# Patient Record
Sex: Female | Born: 1965 | Hispanic: No | Marital: Married | Smoking: Former smoker
Health system: Southern US, Community
[De-identification: ages and names within clinical notes are randomized; demographics above are authoritative.]

## PROBLEM LIST (undated history)

## (undated) DIAGNOSIS — N83209 Unspecified ovarian cyst, unspecified side: Secondary | ICD-10-CM

## (undated) DIAGNOSIS — D649 Anemia, unspecified: Secondary | ICD-10-CM

## (undated) DIAGNOSIS — K279 Peptic ulcer, site unspecified, unspecified as acute or chronic, without hemorrhage or perforation: Secondary | ICD-10-CM

## (undated) DIAGNOSIS — K648 Other hemorrhoids: Secondary | ICD-10-CM

## (undated) DIAGNOSIS — K769 Liver disease, unspecified: Secondary | ICD-10-CM

## (undated) DIAGNOSIS — Z227 Latent tuberculosis: Secondary | ICD-10-CM

## (undated) HISTORY — DX: Latent tuberculosis: Z22.7

## (undated) HISTORY — DX: Peptic ulcer, site unspecified, unspecified as acute or chronic, without hemorrhage or perforation: K27.9

## (undated) HISTORY — DX: Unspecified ovarian cyst, unspecified side: N83.209

## (undated) HISTORY — DX: Anemia, unspecified: D64.9

## (undated) HISTORY — DX: Other hemorrhoids: K64.8

## (undated) HISTORY — DX: Liver disease, unspecified: K76.9

---

## 1998-09-19 DIAGNOSIS — Z227 Latent tuberculosis: Secondary | ICD-10-CM

## 1998-09-19 HISTORY — DX: Latent tuberculosis: Z22.7

## 1999-09-20 HISTORY — PX: LAPAROSCOPY: SHX197

## 2006-09-19 DIAGNOSIS — K769 Liver disease, unspecified: Secondary | ICD-10-CM

## 2006-09-19 HISTORY — DX: Liver disease, unspecified: K76.9

## 2010-07-21 ENCOUNTER — Ambulatory Visit: Payer: Self-pay | Admitting: Family Medicine

## 2010-08-13 ENCOUNTER — Ambulatory Visit: Payer: Self-pay | Admitting: Physician Assistant

## 2010-08-13 ENCOUNTER — Encounter: Admission: RE | Admit: 2010-08-13 | Discharge: 2010-08-13 | Payer: Self-pay | Admitting: Family Medicine

## 2010-10-20 DIAGNOSIS — K279 Peptic ulcer, site unspecified, unspecified as acute or chronic, without hemorrhage or perforation: Secondary | ICD-10-CM

## 2010-10-20 HISTORY — PX: ESOPHAGOGASTRODUODENOSCOPY: SHX1529

## 2010-10-20 HISTORY — DX: Peptic ulcer, site unspecified, unspecified as acute or chronic, without hemorrhage or perforation: K27.9

## 2010-10-20 HISTORY — PX: COLONOSCOPY: SHX5424

## 2011-04-17 IMAGING — CR DG THORACIC SPINE 3V
3 series · 3 of 3 positions shown · non-contrast
Comparison: None.

CLINICAL DATA: Chest and upper back pain.

THORACIC SPINE - 2 VIEW + SWIMMERS

[t t-spine a.p.]
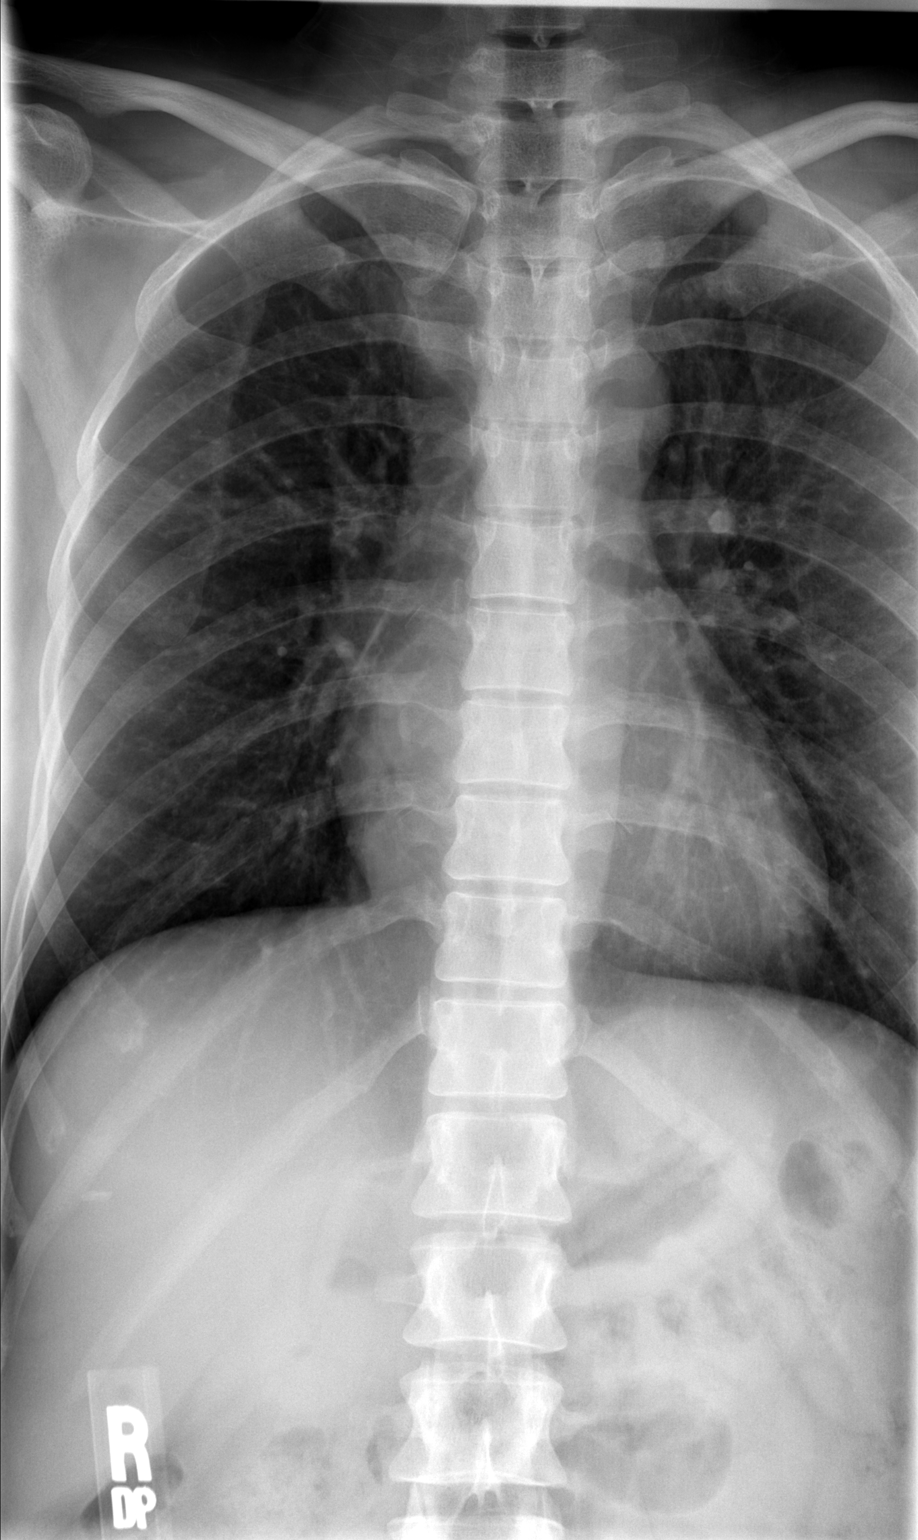

[t t-spine lat *]
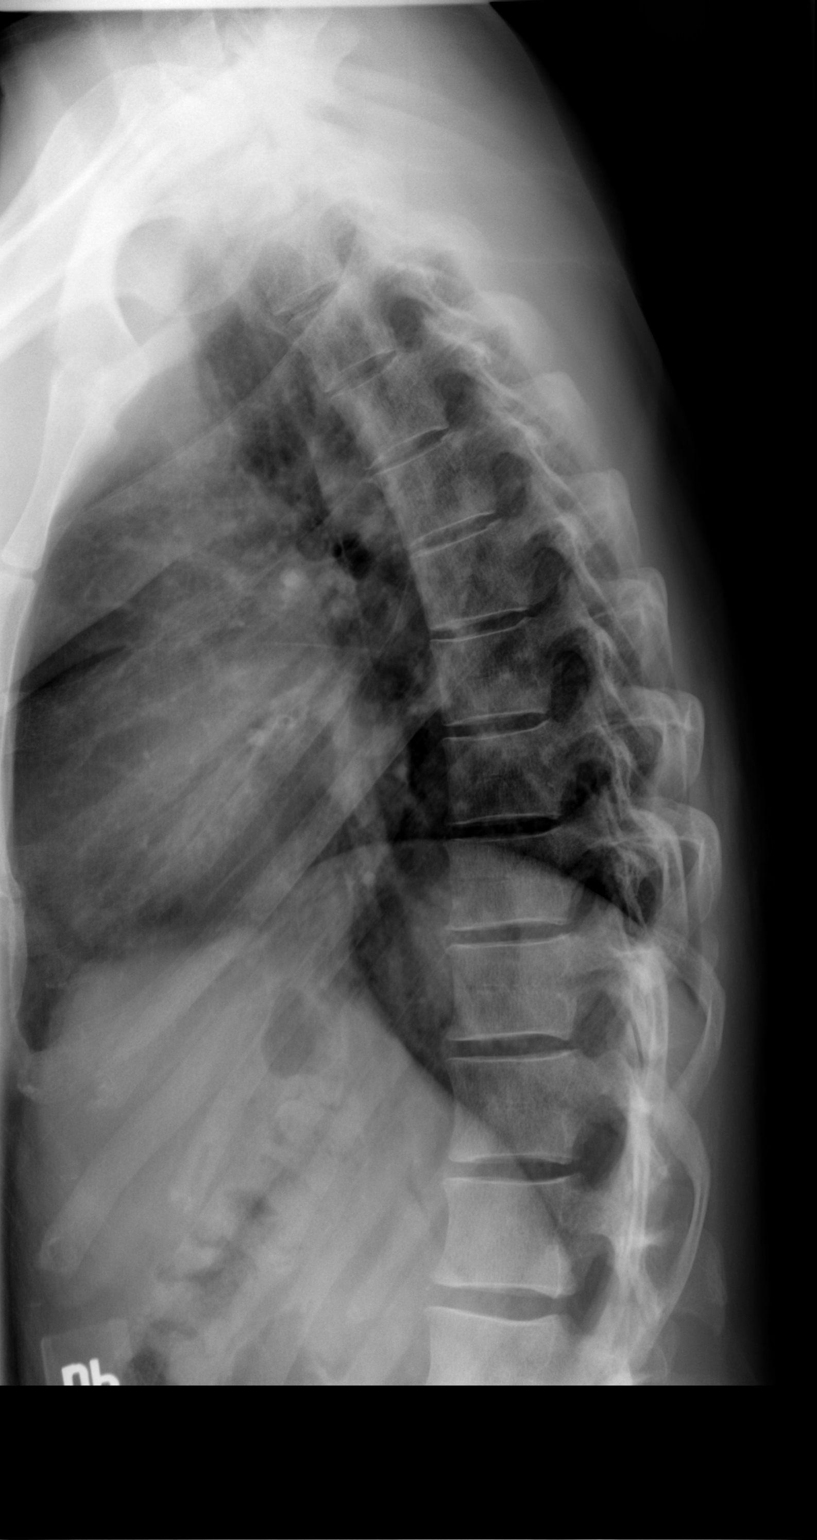

[t swimmers]
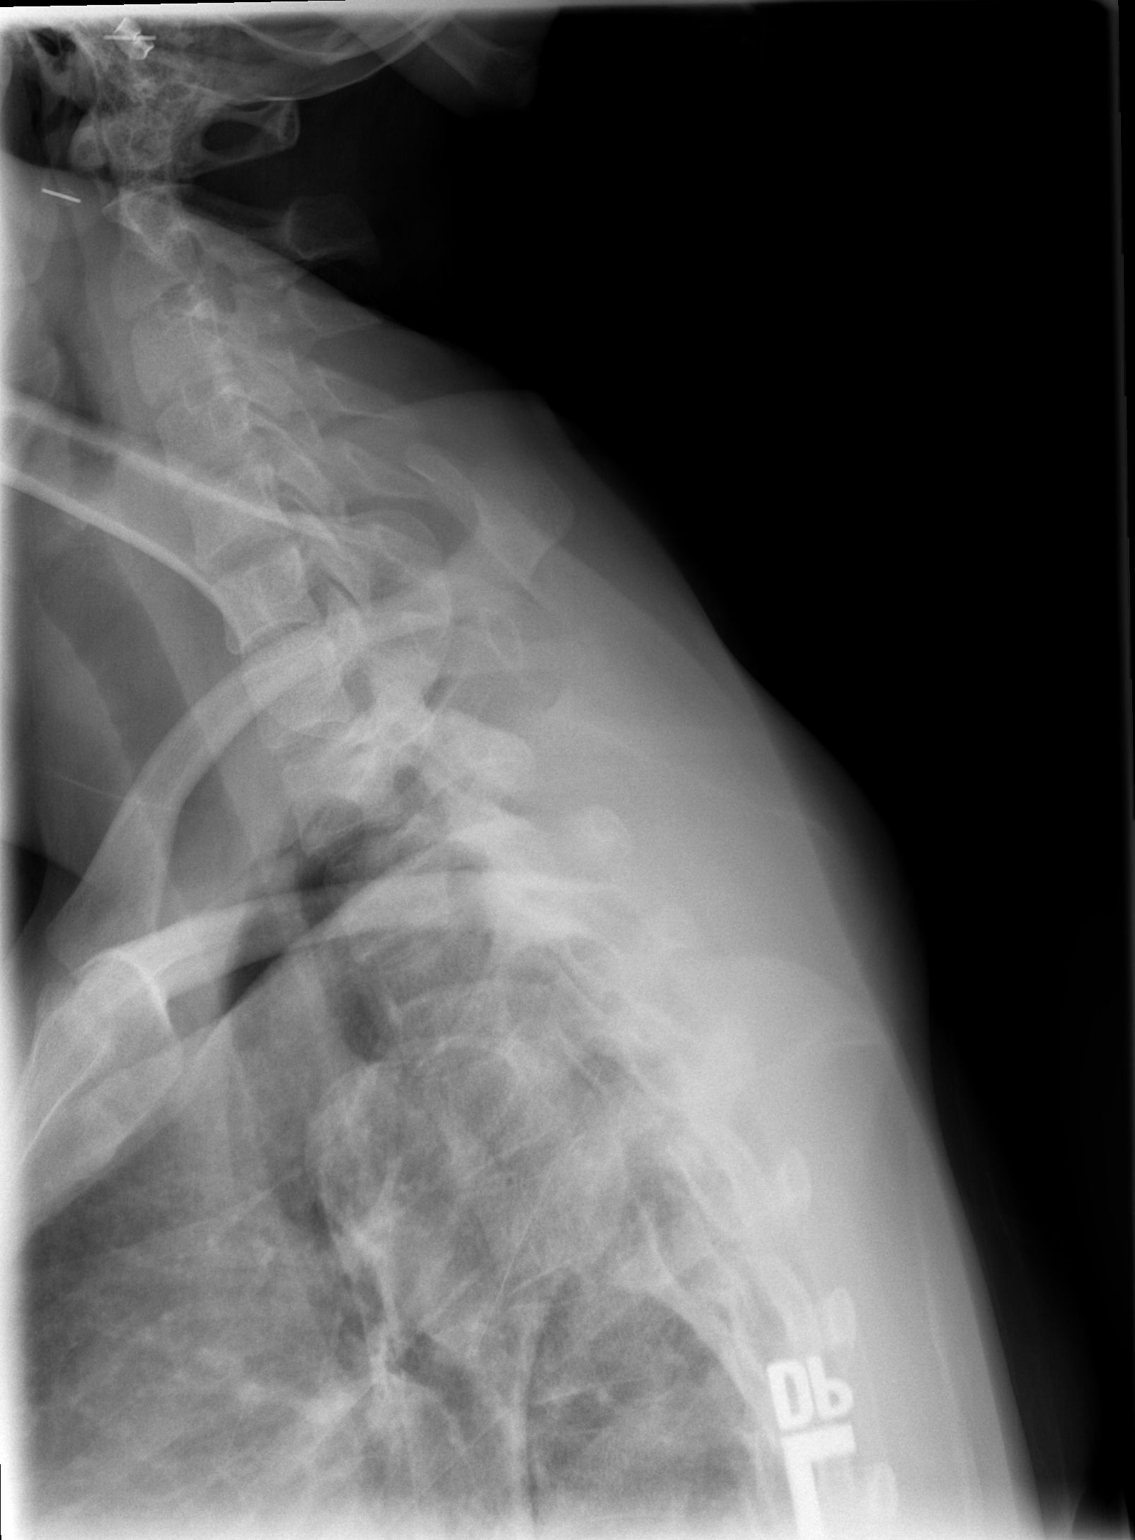

[3 of 3 positions shown; findings below may reference images not displayed]

FINDINGS: Two-view exam shows no evidence for acute fracture.  No
subluxation.  Intervertebral disc spaces are preserved throughout.
There is no worrisome lytic or sclerotic osseous abnormality.  No
abnormal paraspinal line on the frontal film.
IMPRESSION: Normal study.

## 2011-04-17 IMAGING — CR DG CHEST 2V
2 series · 2 of 2 positions shown · non-contrast
Comparison: None.

CLINICAL DATA: Chest and upper back pain.

CHEST - 2 VIEW

[w chest pa]
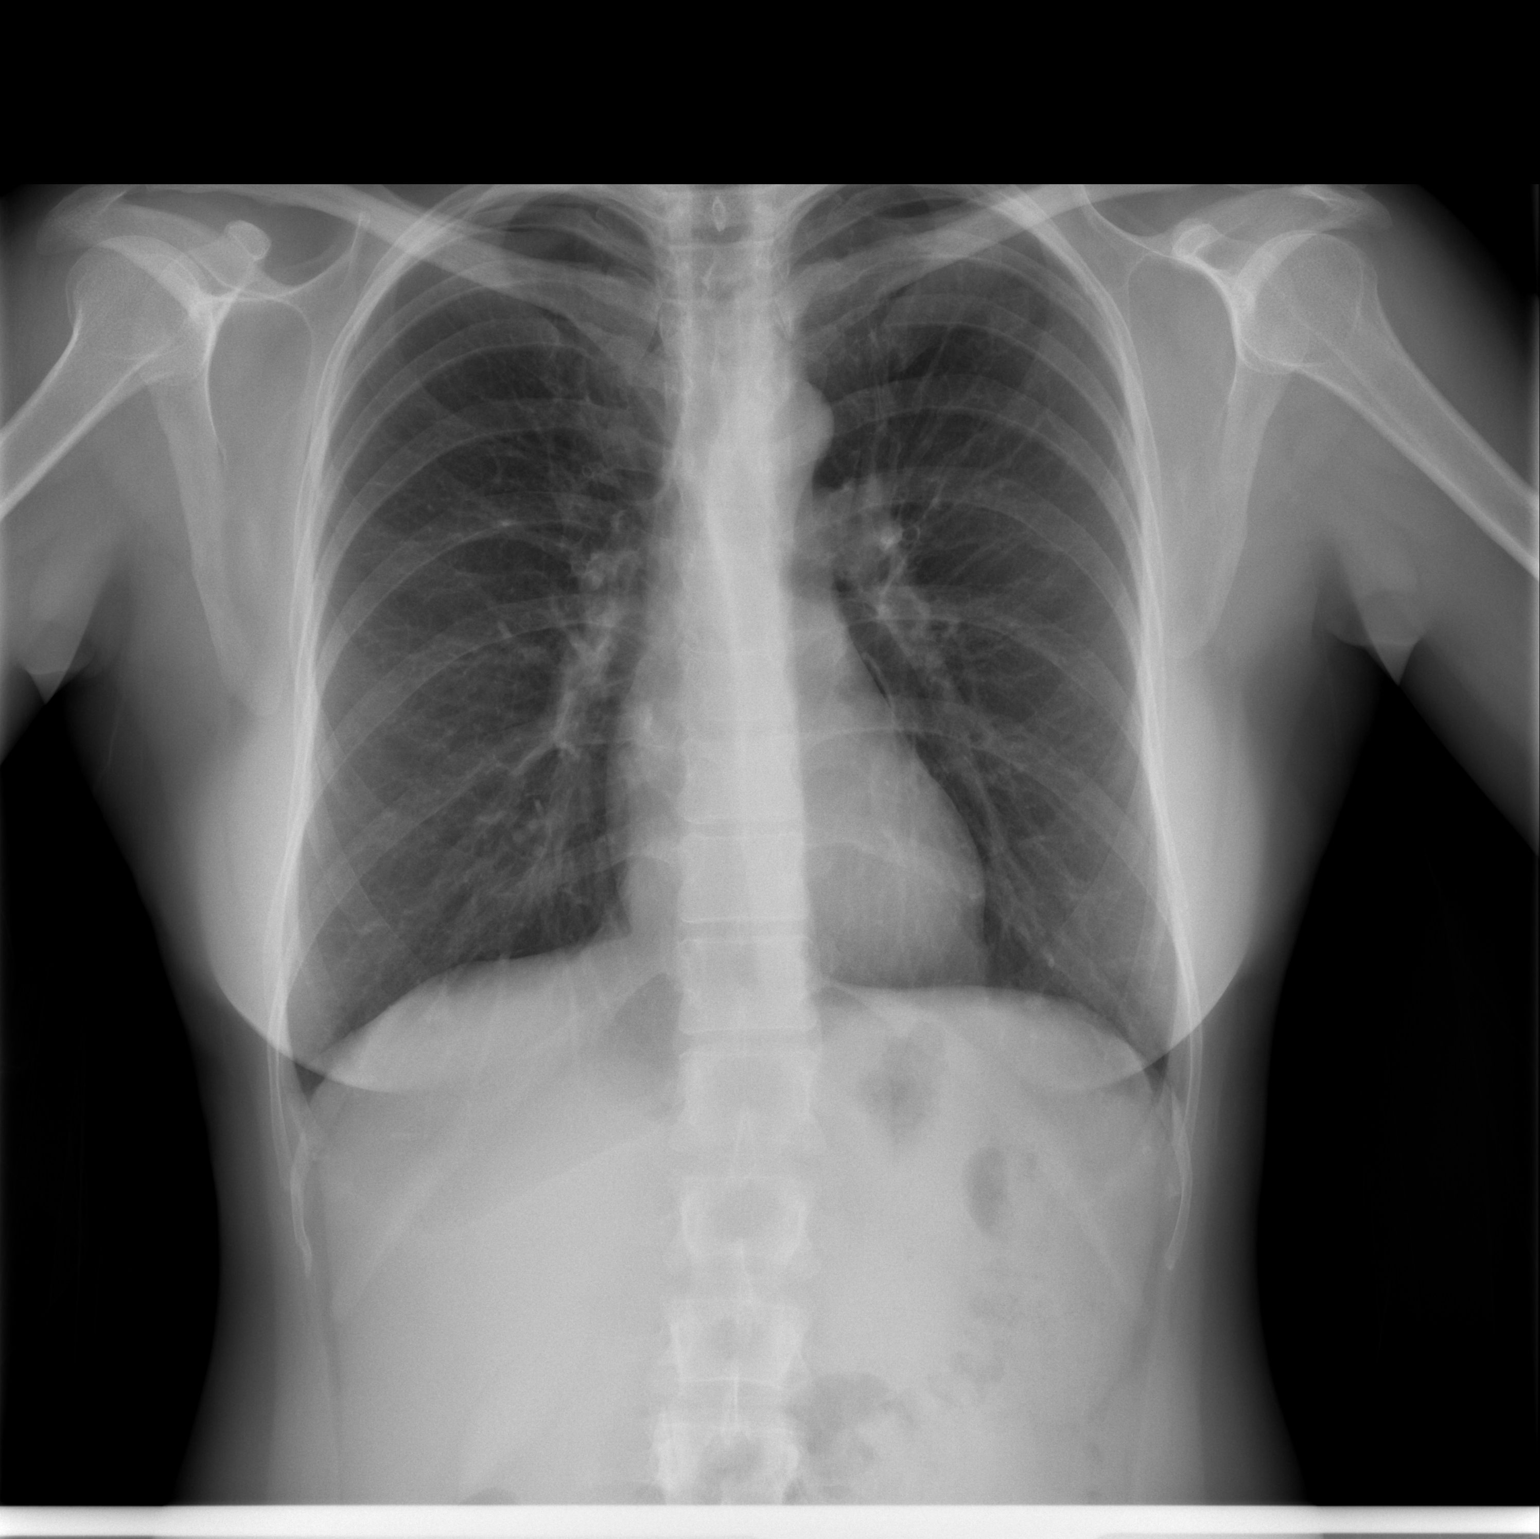

[w chest lat]
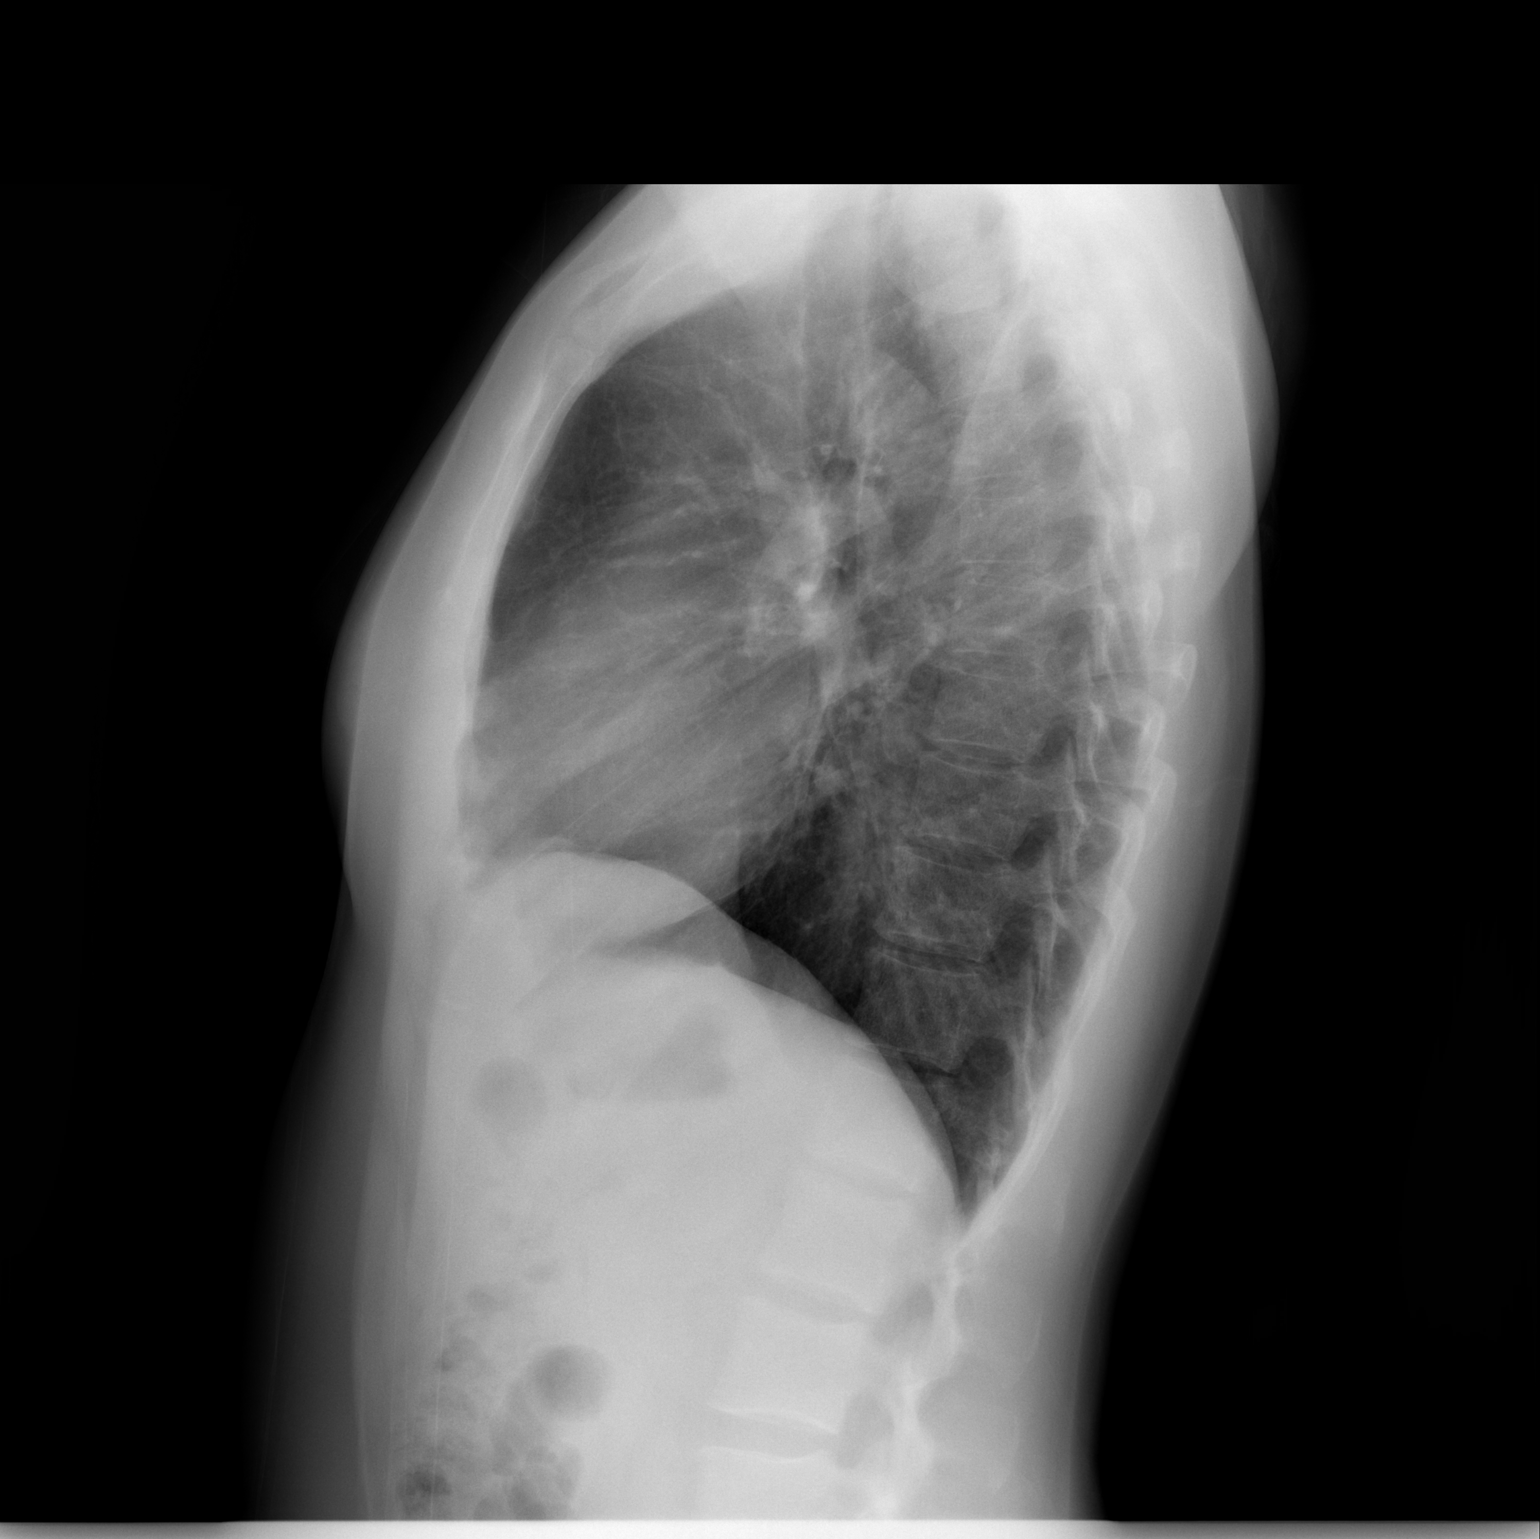

[2 of 2 positions shown; findings below may reference images not displayed]

FINDINGS: The lungs are clear without focal infiltrate, edema,
pneumothorax or pleural effusion. The cardiopericardial silhouette
is within normal limits for size. Imaged bony structures of the
thorax are intact.
IMPRESSION: No acute cardiopulmonary process.

## 2011-08-01 ENCOUNTER — Encounter: Payer: Self-pay | Admitting: Family Medicine

## 2011-08-01 ENCOUNTER — Ambulatory Visit (INDEPENDENT_AMBULATORY_CARE_PROVIDER_SITE_OTHER): Payer: BC Managed Care – PPO | Admitting: Family Medicine

## 2011-08-01 VITALS — BP 102/74 | HR 72 | Ht 62.0 in | Wt 133.0 lb

## 2011-08-01 DIAGNOSIS — M654 Radial styloid tenosynovitis [de Quervain]: Secondary | ICD-10-CM

## 2011-08-01 DIAGNOSIS — R109 Unspecified abdominal pain: Secondary | ICD-10-CM

## 2011-08-01 DIAGNOSIS — N644 Mastodynia: Secondary | ICD-10-CM

## 2011-08-01 DIAGNOSIS — D649 Anemia, unspecified: Secondary | ICD-10-CM

## 2011-08-01 DIAGNOSIS — G5600 Carpal tunnel syndrome, unspecified upper limb: Secondary | ICD-10-CM

## 2011-08-01 DIAGNOSIS — M940 Chondrocostal junction syndrome [Tietze]: Secondary | ICD-10-CM

## 2011-08-01 LAB — COMPREHENSIVE METABOLIC PANEL
AST: 20 U/L (ref 0–37)
Alkaline Phosphatase: 46 U/L (ref 39–117)
BUN: 11 mg/dL (ref 6–23)
Glucose, Bld: 89 mg/dL (ref 70–99)
Sodium: 138 mEq/L (ref 135–145)
Total Bilirubin: 0.4 mg/dL (ref 0.3–1.2)
Total Protein: 7 g/dL (ref 6.0–8.3)

## 2011-08-01 LAB — POCT URINALYSIS DIPSTICK
Blood, UA: NEGATIVE
Glucose, UA: NEGATIVE
Ketones, UA: NEGATIVE
Spec Grav, UA: 1.01

## 2011-08-01 LAB — CBC WITH DIFFERENTIAL/PLATELET
Basophils Absolute: 0 10*3/uL (ref 0.0–0.1)
Basophils Relative: 0 % (ref 0–1)
Eosinophils Absolute: 0.1 10*3/uL (ref 0.0–0.7)
Hemoglobin: 13 g/dL (ref 12.0–15.0)
MCH: 26.9 pg (ref 26.0–34.0)
MCHC: 32.2 g/dL (ref 30.0–36.0)
Monocytes Relative: 10 % (ref 3–12)
Neutro Abs: 4.9 10*3/uL (ref 1.7–7.7)
Neutrophils Relative %: 75 % (ref 43–77)
RDW: 16 % — ABNORMAL HIGH (ref 11.5–15.5)

## 2011-08-01 MED ORDER — NAPROXEN 500 MG PO TABS: 500.0000 mg | ORAL_TABLET | Freq: Two times a day (BID) | ORAL | Status: DC

## 2011-08-01 MED ORDER — ESOMEPRAZOLE MAGNESIUM 40 MG PO CPDR: 40.0000 mg | DELAYED_RELEASE_CAPSULE | Freq: Every day | ORAL | Status: DC

## 2011-08-01 NOTE — Patient Instructions (Addendum)
Anti-inflammatories should help with the chest/rib pain.  Moist heat might also help.  Take the Nexium along with the naprosyn in order to prevent inflammation of stomach/ulcers  The anti-inflammatories should also help with the thumb pain, carpal tunnel syndrome, and back pain.   Heat and stretches can also help with back pain  Schedule your mammogram--try cutting back on caffeine and maybe try some Vitamin E  You have a component of carpal tunnel syndrome and thumb tendonitis--the anti-inflammatories will help with this.  You may also want to wear a wrist brace when typing, or sleeping (if you're waking up with numbness in first 3-4 fingers)  Follow up in 2 weeks if symptoms aren't completely resolved.  Costochondritis Costochondritis (Tietze syndrome), or costochondral separation, is a swelling and irritation (inflammation) of the tissue (cartilage) that connects your ribs with your breastbone (sternum). It may occur on its own (spontaneously), through damage caused by an accident (trauma), or simply from coughing or minor exercise. It may take up to 6 weeks to get better and longer if you are unable to be conservative in your activities. HOME CARE INSTRUCTIONS   Avoid exhausting physical activity. Try not to strain your ribs during normal activity. This would include any activities using chest, belly (abdominal), and side muscles, especially if heavy weights are used.   Use ice for 15 to 20 minutes per hour while awake for the first 2 days. Place the ice in a plastic bag, and place a towel between the bag of ice and your skin.   Only take over-the-counter or prescription medicines for pain, discomfort, or fever as directed by your caregiver.  SEEK IMMEDIATE MEDICAL CARE IF:   Your pain increases or you are very uncomfortable.   You have a fever.   You develop difficulty with your breathing.   You cough up blood.   You develop worse chest pains, shortness of breath, sweating, or  vomiting.   You develop new, unexplained problems (symptoms).  MAKE SURE YOU:   Understand these instructions.   Will watch your condition.   Will get help right away if you are not doing well or get worse.  Document Released: 06/15/2005 Document Revised: 05/18/2011 Document Reviewed: 04/23/2008 [facility] Patient Information 2012 [place], [place].

## 2011-08-01 NOTE — Progress Notes (Signed)
Patient presents with multiple concerns and complaints:  Abdominal pain x 1 week.  Having lower abdominal pain.  Denies vaginal discharge, odor or itch.  Denies dysuria, urgency or frequency. LMP ended 7 days ago. Bowels are normal.  She has a history of ovarian cysts in the past.  She also complains of pain under her ribs on both sides every morning x 1 week.  It is tender to touch over her lateral ribs bilaterally.  She recalls these symptoms being similar to when she had ulcers in the past. Also having some chest pain, seems to be better after she takes her iron. Describes the pain as being in her bones, tender to touch and feels like she needs to pop it.  Has some problems with her thoracic spine.  Sharp stabbing pains to L breast that was checked out 1-2 years ago.  Started having recurrent pain L breast for a week, described as a burning pain, tender to touch.  X-rays of neck and back have reportedly been normal in the past.  Sometimes has sharp, short-lived pain across mid back near bra strap. Neck pain with weather changes, gets stiff  Also wakes up with her hands numb and tingling, all the way up her arm.  Resolves on its own;  Affected arm depends on position she is sleeping in.  Usually isn't both arms at the same time.  Usually involves ulnar side of hand/arm, thumb isn't numb. Sometimes has pain/numbness into palms also.  Having pain in base of both thumbs, off and on, but worse in the last 2 weeks .Denies any change in activity.  10/2010 saw Dr. Loreta Ave with blood in stool.  Had colonoscopy (internal hemorrhoids) and EGD which showed multple antral erosions and ulcers. H. Pylori was +, and was treated for it.  She had a follow-up breath test that was normal.  Has been doing fine since then, until last week.  Denies any heartburn, nausea, vomiting.  Has seen chiropractor in past for 6 weeks, when she first had pain in L breast (2011).  It seemed to help.  Stopped mainly due to expense.  Past  Medical History  Diagnosis Date  . PUD (peptic ulcer disease) 10/2010    Dr Mann--EGD showed many antral erosions with ulcers; treated for H Pylori  . Anemia   . Other and unspecified ovarian cysts     resolved.  GYN in [place]  . 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 Internal hemorrhoids     seen on colonoscopy 10/2010    Past Surgical History  Procedure Date  . Laparoscopy 2001    ovarian cysts  . Colonoscopy 10/2010    Dr. Loreta Ave; internal hemorrhoids  . Esophagogastroduodenoscopy 10/2010    Dr. Loreta Ave; +H pylori, antral erosions/ulcerations    History   Social History  . Marital Status: Married    Spouse Name: N/A    Number of Children: 2  . Years of Education: N/A   Occupational History  . Production designer, theatre/television/film of cafeteria at [employer]    Social History Main Topics  . Smoking status: Former Smoker    Types: Cigarettes    Quit date: 08/01/1991  . Smokeless tobacco: Never Used  . Alcohol Use: Yes     glass of wine every few months.  . Drug Use: No  . Sexually Active: Not on file   Other Topics Concern  . Not on file  Social History Narrative   Lives with husband, 2 daughters and a yorkie    Family History  Problem Relation Age of Onset  . Diabetes Mother   . Arthritis Mother   . Hypertension Mother   . Heart disease Father   . Hypertension Father   . Cancer Neg Hx    No Known Allergies  ROS:  Denies fevers, URI symptoms, shortness of breath.  Some pleuritic chest pain.  Denies nausea, vomiting, bowel changes.  No urinary or GYN complaints.  See HPI for details of joint pains, paresthesias, chest pain and abdominal pain  PHYSICAL EXAM: BP 102/74  Pulse 72  Ht 5\' 2"  (1.575 m)  Wt 133 lb (60.328 kg)  BMI 24.33 kg/m2  LMP 07/26/2011 Well developed, pleasant female in no distress Neck: c-spine nontender.  No lymphadenopathy, thyromegaly or mass Heart: regular rate and rhythm without  murmur Lungs: clear bilaterally with good air movement Extremities: +phalen (tingling into both middle fingers and middle of hand) +finkelstein test on R.  Pain with thumb extension against resistance, and pain at base of thumb. No soft tissue swelling.  Mild tenderness over R>L ulnar nerves.  No clubbing, cyanosis or edema. 2+ distal pulses. Tender at mid thoracic spine, and more tender medial to scapula bilaterally, with some spasm of rhomboids L>R.  Minimal tenderness at mid lumbar spine.  No paraspinous spasm or tenderness in lumbar region. Tender at costochondral junctions bilaterally at sternum, and along floating ribs Abdomen: mild epigastric tenderness.  Tender suprapubically, and mildly across to both lower quadrants. No rebound tenderness or guarding Breast exam--mild tenderness laterally bilaterally. No discrete masses.  No nipple discharge or axillary adenopathy  ASSESSMENT/PLAN:  1. Costochondritis    2. Anemia, unspecified  CBC with Differential, Comprehensive metabolic panel  3. Abdominal  pain, other specified site  Comprehensive metabolic panel, POCT Urinalysis Dipstick   suprapubic, and somewhat diffusely.  Normal urine dip  4. Carpal tunnel syndrome     R>L  5. Mastalgia    6. DeQuervain's disease (tenosynovitis)     right   Morning hand numbness due combination of CTS and compression of ulnar nerve related to sleeping position--reassured  Costochondritis--treat with Naproxen, moist heat  H/o ulcers and H pylori with negative f/u test (per patient--no records in chart).  Will put her on Nexium while on NSAIDS  Breast pain--likely fibrocystic in nature.  Continue with yearly mammograms--needs to schedule.  Discussed cutting back on caffeine, possible trial of Vitamin E

## 2011-08-04 ENCOUNTER — Telehealth: Payer: Self-pay | Admitting: *Deleted

## 2011-08-04 NOTE — Telephone Encounter (Signed)
Patient called and stated that her she is experiencing sinus pain/pressure. Her palate is inflammed, she can hardly breathe. What do you recommend?

## 2011-08-04 NOTE — Telephone Encounter (Signed)
Spoke with patient and let her know that Dr.Knapp recommended sudafed/decongestant and mucinex. Patient assured me that she was not having any SOB, just unable to breath through her nose because she is stopped up. She will schedule OV if not better by next week.

## 2011-08-04 NOTE — Telephone Encounter (Signed)
I recommend sudafed/decongestant and mucinex (expectorant to loosen phlegm).  Tylenol for pain/fever that doesn't respond to the naprosyn that she's already taking.  She can also try sinus rinses to help with sinus pain/pressure.  Offer OV if worsening.  Make sure that she is having trouble just breathing through her NOSE, and not any shortness of breath.

## 2012-04-18 ENCOUNTER — Encounter: Payer: Self-pay | Admitting: Family Medicine

## 2012-04-18 ENCOUNTER — Ambulatory Visit (INDEPENDENT_AMBULATORY_CARE_PROVIDER_SITE_OTHER): Payer: BC Managed Care – PPO | Admitting: Family Medicine

## 2012-04-18 VITALS — BP 100/60 | HR 72 | Ht 62.0 in | Wt 130.0 lb

## 2012-04-18 DIAGNOSIS — M7710 Lateral epicondylitis, unspecified elbow: Secondary | ICD-10-CM

## 2012-04-18 DIAGNOSIS — M76899 Other specified enthesopathies of unspecified lower limb, excluding foot: Secondary | ICD-10-CM

## 2012-04-18 DIAGNOSIS — M7061 Trochanteric bursitis, right hip: Secondary | ICD-10-CM

## 2012-04-18 DIAGNOSIS — M7062 Trochanteric bursitis, left hip: Secondary | ICD-10-CM | POA: Insufficient documentation

## 2012-04-18 MED ORDER — MELOXICAM 15 MG PO TABS: 15.0000 mg | ORAL_TABLET | Freq: Every day | ORAL | Status: AC

## 2012-04-18 NOTE — Progress Notes (Signed)
Chief Complaint  Patient presents with  . Arm Pain    left arm pain x 2 month and all over joint pain x 1 week.    HPI:  Pain at left lateral elbow, and also at medial elbow.  Has a sore spot, like a bump in her upper lateral left arm also.  Pain in elbow started 2 months ago (early June).   She has pain with supination of her left arm.  Denies any change in activity, injury or trauma.  Last week started with low back pain, hip pain (bilateral), Right wrist pain, finger pain, neck pain. Thinks there might be some swelling of L elbow, but no swelling in other joints.  She took her husband's meloxicam and it helped a little (just took one tablet).  Also took ibuprofen with some benefit (took 2 pills just twice).  She is fasting and wants labs to be done to check for diabetes and other problems--but chart was reviewed and had labs in November, none needed now.  Past Medical History  Diagnosis Date  . PUD (peptic ulcer disease) 10/2010    Dr Mann--EGD showed many antral erosions with ulcers; treated for H Pylori  . Anemia   . Other and unspecified ovarian cysts     resolved.  GYN in [place]  . 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 Internal hemorrhoids     seen on colonoscopy 10/2010   Past Surgical History  Procedure Date  . Laparoscopy 2001    ovarian cysts  . Colonoscopy 10/2010    Dr. Loreta Ave; internal hemorrhoids  . Esophagogastroduodenoscopy 10/2010    Dr. Loreta Ave; +H pylori, antral erosions/ulcerations   ROS: Denies fevers, URI symptoms, skin lesions, GI complaints, chest pain, shortness of breath  PHYSICAL EXAM: BP 100/60  Pulse 72  Ht 5\' 2"  (1.575 m)  Wt 130 lb (58.968 kg)  BMI 23.78 kg/m2  LMP 04/14/2012 Well developed, pleasant female in no distress Tender at Lateral>Medial epicondylitis.  Pain with supination against resistance (and pain with supination/ROM even without resistance).  Strength 5/5,  normal sensation. Tender over trochanteric bursae bilaterally Mild tenderness over lumbar spine, rest of spine nontender.  Also tender at R SI joint.  No muscle spasm DTR's 2+ and symmetric.  Normal strength, sensation and gait  Normal c-met and CBC done in November, so no labs needed today.  ASSESSMENT/PLAN: 1. Lateral epicondylitis  meloxicam (MOBIC) 15 MG tablet  2. Trochanteric bursitis of both hips     Reviewed risks/side effects of medications. Trial of tennis elbow strap.  Expect the mobic to help with all MSK complaints.  Unsure why she is having diffuse pain--?viral.  F/u if ongoing problems with pain, then can consider further workup (CBC, ESR, RF, ANA)

## 2012-04-18 NOTE — Patient Instructions (Addendum)
Lateral Epicondylitis (Tennis Elbow) with Rehab Lateral epicondylitis involves inflammation and pain around the outer portion of the elbow. The pain is caused by inflammation of the tendons in the forearm that bring back (extend) the wrist. Lateral epicondylittis is also called tennis elbow, because it is very common in tennis players. However, it may occur in any individual who extends the wrist repetitively. If lateral epicondylitis is left untreated, it may become a chronic problem. SYMPTOMS   Pain, tenderness, and inflammation on the outer (lateral) side of the elbow.  Pain or weakness with gripping activities.  Pain that increases with wrist twisting motions (playing tennis, using a screwdriver, opening a door or a jar).  Pain with lifting objects, including a coffee cup. CAUSES  Lateral epicondylitis is caused by inflammation of the tendons that extend the wrist. Causes of injury may include:  Repetitive stress and strain on the muscles and tendons that extend the wrist.  Sudden change in activity level or intensity.  Incorrect grip in racquet sports.  Incorrect grip size of racquet (often too large).  Incorrect hitting position or technique (usually backhand, leading with the elbow).  Using a racket that is too heavy. RISK INCREASES WITH:  Sports or occupations that require repetitive and/or strenuous forearm and wrist movements (tennis, squash, racquetball, carpentry).  Poor wrist and forearm strength and flexibility.  Failure to warm up properly before activity.  Resuming activity before healing, rehabilitation, and conditioning are complete. PREVENTION   Warm up and stretch properly before activity.  Maintain physical fitness:  Strength, flexibility, and endurance.  Cardiovascular fitness.  Wear and use properly fitted equipment.  Learn and use proper technique and have a coach correct improper technique.  Wear a tennis elbow (counterforce) brace. PROGNOSIS   The course of this condition depends on the degree of the injury. If treated properly, acute cases (symptoms lasting less than 4 weeks) are often resolved in 2 to 6 weeks. Chronic (longer lasting cases) often resolve in 3 to 6 months, but may require physical therapy. RELATED COMPLICATIONS   Frequently recurring symptoms, resulting in a chronic problem. Properly treating the problem the first time decreases frequency of recurrence.  Chronic inflammation, scarring tendon degeneration, and partial tendon tear, requiring surgery.  Delayed healing or resolution of symptoms. TREATMENT  Treatment first involves the use of ice and medicine, to reduce pain and inflammation. Strengthening and stretching exercises may help reduce discomfort, if performed regularly. These exercises may be performed at home, if the condition is an acute injury. Chronic cases may require a referral to a physical therapist for evaluation and treatment. Your caregiver may advise a corticosteroid injection, to help reduce inflammation. Rarely, surgery is needed. MEDICATION  If pain medicine is needed, nonsteroidal anti-inflammatory medicines (aspirin and ibuprofen), or other minor pain relievers (acetaminophen), are often advised.  Do not take pain medicine for 7 days before surgery.  Prescription pain relievers may be given, if your caregiver thinks they are needed. Use only as directed and only as much as you need.  Corticosteroid injections may be recommended. These injections should be reserved only for the most severe cases, because they can only be given a certain number of times. HEAT AND COLD  Cold treatment (icing) should be applied for 10 to 15 minutes every 2 to 3 hours for inflammation and pain, and immediately after activity that aggravates your symptoms. Use ice packs or an ice massage.  Heat treatment may be used before performing stretching and strengthening activities prescribed by your   after activity that aggravates your symptoms. Use ice packs or an ice massage.   Heat treatment may be used before performing stretching and strengthening  activities prescribed by your caregiver, physical therapist, or athletic trainer. Use a heat pack or a warm water soak.  SEEK MEDICAL CARE IF: Symptoms get worse or do not improve in 2 weeks, despite treatment. EXERCISES  RANGE OF MOTION (ROM) AND STRETCHING EXERCISES - Epicondylitis, Lateral (Tennis Elbow) These exercises may help you when beginning to rehabilitate your injury. Your symptoms may go away with or without further involvement from your physician, physical therapist or athletic trainer. While completing these exercises, remember:   Restoring tissue flexibility helps normal motion to return to the joints. This allows healthier, less painful movement and activity.   An effective stretch should be held for at least 30 seconds.   A stretch should never be painful. You should only feel a gentle lengthening or release in the stretched tissue.  RANGE OF MOTION - Wrist Flexion, Active-Assisted  Extend your right / left elbow with your fingers pointing down.*   Gently pull the back of your hand towards you, until you feel a gentle stretch on the top of your forearm.   Hold this position for _____5-10__ seconds.  Repeat _____5___ times. Complete this exercise __________ times per day.  *If directed by your physician, physical therapist or athletic trainer, complete this stretch with your elbow bent, rather than extended. RANGE OF MOTION - Wrist Extension, Active-Assisted  Extend your right / left elbow and turn your palm upwards.*   Gently pull your palm and fingertips back, so your wrist extends and your fingers point more toward the ground.   You should feel a gentle stretch on the inside of your forearm.   Hold this position for __________ seconds.  Repeat __________ times. Complete this exercise __________ times per day. *If directed by your physician, physical therapist or athletic trainer, complete this stretch with your elbow bent, rather than extended. STRETCH - Wrist  Flexion  Place the back of your right / left hand on a tabletop, leaving your elbow slightly bent. Your fingers should point away from your body.   Gently press the back of your hand down onto the table by straightening your elbow. You should feel a stretch on the top of your forearm.   Hold this position for __________ seconds.  Repeat __________ times. Complete this stretch __________ times per day.  STRETCH - Wrist Extension   Place your right / left fingertips on a tabletop, leaving your elbow slightly bent. Your fingers should point backwards.   Gently press your fingers and palm down onto the table by straightening your elbow. You should feel a stretch on the inside of your forearm.   Hold this position for __________ seconds.  Repeat __________ times. Complete this stretch __________ times per day.  STRENGTHENING EXERCISES - Epicondylitis, Lateral (Tennis Elbow) These exercises may help you when beginning to rehabilitate your injury. They may resolve your symptoms with or without further involvement from your physician, physical therapist or athletic trainer. While completing these exercises, remember:   Muscles can gain both the endurance and the strength needed for everyday activities through controlled exercises.   Complete these exercises as instructed by your physician, physical therapist or athletic trainer. Increase the resistance and repetitions only as guided.   You may experience muscle soreness or fatigue, but the pain or discomfort you are trying to eliminate should never worsen during these exercises. If  this pain does get worse, stop and make sure you are following the directions exactly. If the pain is still present after adjustments, discontinue the exercise until you can discuss the trouble with your caregiver.  STRENGTH - Wrist Flexors  Sit with your right / left forearm palm-up and fully supported on a table or countertop. Your elbow should be resting below the  height of your shoulder. Allow your wrist to extend over the edge of the surface.   Loosely holding a __________ weight, or a piece of rubber exercise band or tubing, slowly curl your hand up toward your forearm.   Hold this position for __________ seconds. Slowly lower the wrist back to the starting position in a controlled manner.  Repeat __________ times. Complete this exercise __________ times per day.  STRENGTH - Wrist Extensors  Sit with your right / left forearm palm-down and fully supported on a table or countertop. Your elbow should be resting below the height of your shoulder. Allow your wrist to extend over the edge of the surface.   Loosely holding a __________ weight, or a piece of rubber exercise band or tubing, slowly curl your hand up toward your forearm.   Hold this position for __________ seconds. Slowly lower the wrist back to the starting position in a controlled manner.  Repeat __________ times. Complete this exercise __________ times per day.  STRENGTH - Ulnar Deviators  Stand with a ____________________ weight in your right / left hand, or sit while holding a rubber exercise band or tubing, with your healthy arm supported on a table or countertop.   Move your wrist, so that your pinkie travels toward your forearm and your thumb moves away from your forearm.   Hold this position for __________ seconds and then slowly lower the wrist back to the starting position.  Repeat __________ times. Complete this exercise __________ times per day STRENGTH - Radial Deviators  Stand with a ____________________ weight in your right / left hand, or sit while holding a rubber exercise band or tubing, with your injured arm supported on a table or countertop.   Raise your hand upward in front of you or pull up on the rubber tubing.   Hold this position for __________ seconds and then slowly lower the wrist back to the starting position.  Repeat __________ times. Complete this  exercise __________ times per day. STRENGTH - Forearm Supinators   Sit with your right / left forearm supported on a table, keeping your elbow below shoulder height. Rest your hand over the edge, palm down.   Gently grip a hammer or a soup ladle.   Without moving your elbow, slowly turn your palm and hand upward to a "thumbs-up" position.   Hold this position for __________ seconds. Slowly return to the starting position.  Repeat __________ times. Complete this exercise __________ times per day.  STRENGTH - Forearm Pronators   Sit with your right / left forearm supported on a table, keeping your elbow below shoulder height. Rest your hand over the edge, palm up.   Gently grip a hammer or a soup ladle.   Without moving your elbow, slowly turn your palm and hand upward to a "thumbs-up" position.   Hold this position for __________ seconds. Slowly return to the starting position.  Repeat __________ times. Complete this exercise __________ times per day.  STRENGTH - Grip  Grasp a tennis ball, a dense sponge, or a large, rolled sock in your hand.   Squeeze as hard   as you can, without increasing any pain.   Hold this position for __________ seconds. Release your grip slowly.  Repeat __________ times. Complete this exercise __________ times per day.  STRENGTH - Elbow Extensors, Isometric  Stand or sit upright, on a firm surface. Place your right / left arm so that your palm faces your stomach, and it is at the height of your waist.   Place your opposite hand on the underside of your forearm. Gently push up as your right / left arm resists. Push as hard as you can with both arms, without causing any pain or movement at your right / left elbow. Hold this stationary position for __________ seconds.  Gradually release the tension in both arms. Allow your muscles to relax completely before repeating. Document Released: 09/05/2005 Document Revised: 08/25/2011 Document Reviewed:  12/18/2008 [facility] Patient Information 2012 [place], [place].  You can try a tennis elbow strap that you can get at the pharmacy--wear when doing activities that cause pain in the elbow.  Hip Bursitis Bursitis is a swelling and soreness (inflammation) of a fluid-filled sac (bursa). This sac overlies and protects the joints.  CAUSES   Injury.   Overuse of the muscles surrounding the joint.   Arthritis.   Gout.   Infection.   Cold weather.   Inadequate warm-up and conditioning prior to activities.  The cause may not be known.  SYMPTOMS   Mild to severe irritation.   Tenderness and swelling over the outside of the hip.   Pain with motion of the hip.   If the bursa becomes infected, a fever may be present. Redness, tenderness, and warmth will develop over the hip.  Symptoms usually lessen in 3 to 4 weeks with treatment, but can come back. TREATMENT If conservative treatment does not work, your caregiver may advise draining the bursa and injecting cortisone into the area. This may speed up the healing process. This may also be used as an initial treatment of choice. HOME CARE INSTRUCTIONS   Apply ice to the affected area for 15 to 20 minutes every 3 to 4 hours while awake for the first 2 days. Put the ice in a plastic bag and place a towel between the bag of ice and your skin.   Rest the painful joint as much as possible, but continue to put the joint through a normal range of motion at least 4 times per day. When the pain lessens, begin normal, slow movements and usual activities to help prevent stiffness of the hip.   Only take over-the-counter or prescription medicines for pain, discomfort, or fever as directed by your caregiver.   Use crutches to limit weight bearing on the hip joint, if advised.   Elevate your painful hip to reduce swelling. Use pillows for propping and cushioning your legs and hips.   Gentle massage may provide comfort and decrease swelling.  SEEK  IMMEDIATE MEDICAL CARE IF:   Your pain increases even during treatment, or you are not improving.   You have a fever.   You have heat and inflammation over the involved bursa.   You have any other questions or concerns.  MAKE SURE YOU:   Understand these instructions.   Will watch your condition.   Will get help right away if you are not doing well or get worse.  Document Released: 02/25/2002 Document Revised: 08/25/2011 Document Reviewed: 09/24/2008 [facility] Patient Information 2012 [place], [place].

## 2012-04-19 ENCOUNTER — Ambulatory Visit: Payer: BC Managed Care – PPO | Admitting: Family Medicine

## 2013-07-15 ENCOUNTER — Encounter: Payer: Self-pay | Admitting: Medical

## 2013-07-15 ENCOUNTER — Telehealth: Payer: Self-pay | Admitting: Medical

## 2013-07-15 ENCOUNTER — Ambulatory Visit (INDEPENDENT_AMBULATORY_CARE_PROVIDER_SITE_OTHER): Payer: BC Managed Care – PPO | Admitting: Medical

## 2013-07-15 VITALS — BP 110/60 | HR 64 | Temp 98.3°F | Resp 16 | Wt 136.0 lb

## 2013-07-15 DIAGNOSIS — M25559 Pain in unspecified hip: Secondary | ICD-10-CM

## 2013-07-15 DIAGNOSIS — R82998 Other abnormal findings in urine: Secondary | ICD-10-CM

## 2013-07-15 DIAGNOSIS — R8290 Unspecified abnormal findings in urine: Secondary | ICD-10-CM

## 2013-07-15 DIAGNOSIS — R7301 Impaired fasting glucose: Secondary | ICD-10-CM

## 2013-07-15 DIAGNOSIS — M659 Synovitis and tenosynovitis, unspecified: Secondary | ICD-10-CM

## 2013-07-15 DIAGNOSIS — M549 Dorsalgia, unspecified: Secondary | ICD-10-CM

## 2013-07-15 DIAGNOSIS — G8929 Other chronic pain: Secondary | ICD-10-CM

## 2013-07-15 DIAGNOSIS — M65839 Other synovitis and tenosynovitis, unspecified forearm: Secondary | ICD-10-CM

## 2013-07-15 LAB — COMPREHENSIVE METABOLIC PANEL
Albumin: 4.4 g/dL (ref 3.5–5.2)
BUN: 12 mg/dL (ref 6–23)
CO2: 26 mEq/L (ref 19–32)
Calcium: 9.2 mg/dL (ref 8.4–10.5)
Glucose, Bld: 90 mg/dL (ref 70–99)
Potassium: 4.1 mEq/L (ref 3.5–5.3)
Sodium: 138 mEq/L (ref 135–145)
Total Protein: 6.6 g/dL (ref 6.0–8.3)

## 2013-07-15 LAB — CBC WITH DIFFERENTIAL/PLATELET
Eosinophils Absolute: 0.1 10*3/uL (ref 0.0–0.7)
Lymphocytes Relative: 22 % (ref 12–46)
Lymphs Abs: 1.2 10*3/uL (ref 0.7–4.0)
Neutro Abs: 3.8 10*3/uL (ref 1.7–7.7)
Neutrophils Relative %: 67 % (ref 43–77)
Platelets: 363 10*3/uL (ref 150–400)
RBC: 4.55 MIL/uL (ref 3.87–5.11)
WBC: 5.7 10*3/uL (ref 4.0–10.5)

## 2013-07-15 LAB — POCT URINALYSIS DIPSTICK
Bilirubin, UA: NEGATIVE
Ketones, UA: NEGATIVE
pH, UA: 7

## 2013-07-15 LAB — LIPID PANEL
Cholesterol: 201 mg/dL — ABNORMAL HIGH (ref 0–200)
HDL: 60 mg/dL (ref >39)

## 2013-07-15 MED ORDER — MELOXICAM 15 MG PO TABS: 15.0000 mg | ORAL_TABLET | Freq: Every day | ORAL | Status: DC

## 2013-07-15 NOTE — Progress Notes (Signed)
Subjective: Here for several concerns.  She reports pain in right shoulder x 2 weeks that started a month ago.  After 2 weeks, pains seemed to radiate into right forearm where the pain persists.    Working on Animator and typing worsens, lifting or twisting activity with arm makes it hurt.   Walking for exercise.  Gets continual pain in forearm.  Last year had similar on left arm, but sometimes left forearm gives her problems.  She is a Youth worker.   When she awakes gets pains in hips.  Bilat, worse when awakes in morning, usually around time of period, not daily though.  Muscle tension and pain.  Gets pain in low back along with hip pains. Denies current shoulder pains or weakness.  Denies pain radiating to legs.   Not sure about family hx/o rheumatoid.  She denies specific finger/hand pain or swelling.   She notes hx/o abnormal MRI of liver, not sure about this.  Wants me to look at prior MRI.  Urine looks a little funny like white flecks in it.  No urinary c/o thought.  Wants fasting labs today.     Past Medical History  Diagnosis Date  . PUD (peptic ulcer disease) 10/2010    Dr Mann--EGD showed many antral erosions with ulcers; treated for H Pylori  . Anemia   . Other and unspecified ovarian cysts     resolved.  GYN in [place]  . 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 Internal hemorrhoids     seen on colonoscopy 10/2010   Review of Systems Constitutional: -fever, -chills, -sweats, -unexpected weight change,-fatigue ENT: -runny nose, -ear pain, -sore throat Cardiology:  -chest pain, -palpitations, -edema Respiratory: -cough, -shortness of breath, -wheezing Gastroenterology: -abdominal pain, -nausea, -vomiting, -diarrhea, -constipation Hematology: -bleeding or bruising problems Musculoskeletal: -arthralgias, -myalgias, -joint swelling, -back pain Ophthalmology: -vision changes Urology: -dysuria,  -difficulty urinating, -hematuria, -urinary frequency, -urgency Neurology: -headache, -weakness, -tingling, -numbness    Objective:   Physical Exam  Filed Vitals:   07/15/13 0913  BP: 110/60  Pulse: 64  Temp: 98.3 F (36.8 C)  Resp: 16    General appearance: alert, no distress, WD/WN, pleasant white female, appears younger than stated age. Neck: supple, no lymphadenopathy, no thyromegaly, no masses, nontender, normal ROM Abdomen: +bs, soft, non tender, non distended, no masses, no hepatomegaly, no splenomegaly Back: non tender, normal ROM, no scoliosis Musculoskeletal:  Tender bilat forearms laterally, tender bilat lateral and medial epicondyles, worse R>L, mild pain with resisted supination and pronation, mild tenderness right lateral wrist, otherwise UE and LE nontender, no swelling, no obvious deformity, normal ROM Extremities: no edema, no cyanosis, no clubbing Pulses: 2+ symmetric, upper and lower extremities, normal cap refill Neurological: alert, oriented x 3, CN2-12 intact, strength normal upper extremities and lower extremities, sensation normal throughout, DTRs 2+ throughout, no cerebellar signs, gait normal  Psychiatric: normal affect, behavior normal, pleasant    Assessment and Plan :    Encounter Diagnoses  Name Primary?  . Tenosynovitis of forearm Yes  . Back pain   . Hip pain, chronic, unspecified laterality   . Impaired fasting blood sugar   . Cloudy urine    Tenosynovitis of forearm - advised relative rest, ice, Mobic script, tennis elbow strap, discussed diagnosis, and f/u in 2wk if not much improved.   Back pain, hip pain - seems mild, exam normal.   Begin Mobic, c/t walking, discussed daily  stretching.   Likely some mild arthritis, however, if pains worsen, consider xrays, labs.  Check sed rate today for possible PMR.  Impaired glucose - fasting labs today  cloudy urine - asymptomatic otherwise, labs, urine culture today.  Follow-up soon for physical  and discuss labs.

## 2013-07-15 NOTE — Telephone Encounter (Signed)
pls let her know that the prior MRI 2010 shows stable mass, not particularly worrisome, but they were completley sure what this was.  She should return soon for physical. We can possibly repeat scan of abdomen to ensure stability, but lets discuss in light of labs too.    Please sort her prior records.  Her paper chart has everything jumbled together.

## 2013-07-15 NOTE — Patient Instructions (Addendum)
Right forearm pain - tenosynovitis  Begin Mobic tablet once daily for pain and inflammation.  Begin using ice pack on the forearm/elbow 20 minutes on/20 minutes off daily for 1-2 weeks.  Try and rest the right arm when possible, or avoid excess lifting, pulling, etc. For now.  Begin using tennis elbow strap to right forearm.  I wrote a prescription for this.  If not improving in 2 weeks, then recheck, or let me know.  We will do labs today.    Consider returning for physical soon.    Tendinitis Tendinitis is swelling and inflammation of the tendons. Tendons are band-like tissues that connect muscle to bone. Tendinitis commonly occurs in the:   Shoulders (rotator cuff).  Heels (Achilles tendon).  Elbows (triceps tendon). CAUSES Tendinitis is usually caused by overusing the tendon, muscles, and joints involved. When the tissue surrounding a tendon (synovium) becomes inflamed, it is called tenosynovitis. Tendinitis commonly develops in people whose jobs require repetitive motions. SYMPTOMS  Pain.  Tenderness.  Mild swelling. DIAGNOSIS Tendinitis is usually diagnosed by physical exam. Your caregiver may also order X-rays or other imaging tests. TREATMENT Your caregiver may recommend certain medicines or exercises for your treatment. HOME CARE INSTRUCTIONS   Use a sling or splint for as long as directed by your caregiver until the pain decreases.  Put ice on the injured area.  Put ice in a plastic bag.  Place a towel between your skin and the bag.  Leave the ice on for 15-20 minutes, 3-4 times a day.  Avoid using the limb while the tendon is painful. Perform gentle range of motion exercises only as directed by your caregiver. Stop exercises if pain or discomfort increase, unless directed otherwise by your caregiver.  Only take over-the-counter or prescription medicines for pain, discomfort, or fever as directed by your caregiver. SEEK MEDICAL CARE IF:   Your pain and  swelling increase.  You develop new, unexplained symptoms, especially increased numbness in the hands. MAKE SURE YOU:   Understand these instructions.  Will watch your condition.  Will get help right away if you are not doing well or get worse. Document Released: 09/02/2000 Document Revised: 11/28/2011 Document Reviewed: 11/22/2010 [facility] Patient Information 2014 [place], [place].

## 2013-07-16 ENCOUNTER — Other Ambulatory Visit: Payer: Self-pay | Admitting: Medical

## 2013-07-16 LAB — URINE CULTURE: Colony Count: >=100000

## 2013-07-16 LAB — SEDIMENTATION RATE: Sed Rate: 8 mm/hr (ref 0–22)

## 2013-07-16 MED ORDER — CIPROFLOXACIN HCL 500 MG PO TABS: 500.0000 mg | ORAL_TABLET | Freq: Two times a day (BID) | ORAL | Status: DC

## 2013-07-16 NOTE — Telephone Encounter (Signed)
Patient states that she already has her physical appointment scheduled with her ob/gyn doctor but she will schedule a follow up appointment to discuss her MRI follow up. CLS

## 2013-07-22 ENCOUNTER — Telehealth: Payer: Self-pay | Admitting: Family Medicine

## 2013-07-22 NOTE — Telephone Encounter (Signed)
Patient called and would like to know if you could order the MRI without her coming back in for a OV. CLS

## 2013-07-22 NOTE — Telephone Encounter (Signed)
Patient is aware and she has an appointment tomorrow. CLS

## 2013-07-22 NOTE — Telephone Encounter (Signed)
No.  Needs OV to discuss

## 2013-07-23 ENCOUNTER — Ambulatory Visit (INDEPENDENT_AMBULATORY_CARE_PROVIDER_SITE_OTHER): Payer: BC Managed Care – PPO | Admitting: Medical

## 2013-07-23 ENCOUNTER — Encounter: Payer: Self-pay | Admitting: Medical

## 2013-07-23 VITALS — BP 110/70 | HR 72 | Temp 98.1°F | Resp 16

## 2013-07-23 DIAGNOSIS — Z8719 Personal history of other diseases of the digestive system: Secondary | ICD-10-CM

## 2013-07-23 DIAGNOSIS — B379 Candidiasis, unspecified: Secondary | ICD-10-CM

## 2013-07-23 DIAGNOSIS — R1900 Intra-abdominal and pelvic swelling, mass and lump, unspecified site: Secondary | ICD-10-CM

## 2013-07-23 DIAGNOSIS — R5381 Other malaise: Secondary | ICD-10-CM

## 2013-07-23 DIAGNOSIS — N390 Urinary tract infection, site not specified: Secondary | ICD-10-CM

## 2013-07-23 DIAGNOSIS — D649 Anemia, unspecified: Secondary | ICD-10-CM

## 2013-07-23 LAB — IRON AND TIBC: TIBC: 379 ug/dL (ref 250–470)

## 2013-07-23 LAB — T4, FREE: Free T4: 1 ng/dL (ref 0.80–1.80)

## 2013-07-23 NOTE — Progress Notes (Signed)
Subjective:  Kristin Fisher is a 47 y.o. female who presents for recheck.  I saw her recently for multiple c/o.  We did labs.  Today she is here to discuss labs.   She notes hx/o abnormal CT abdomen years ago with mass.  Wants f/u on this.    She took the Cipro for the apparent UTI.  She then developed yeast infection.  Saw gyn today for pap, routine care.   Started on diflucan.   She has hx/o anemia related to prior gastric ulcers, Hpylori +.    Saw Dr. Loreta Ave for this, had endoscopies, treatment.   She also has heavy periods.  No hx/o fibroids.   No other c/o.  The following portions of the patient's history were reviewed and updated as appropriate: allergies, current medications, past family history, past medical history, past social history, past surgical history and problem list.  ROS Otherwise as in subjective above  Objective: Physical Exam  BP 110/70  Pulse 72  Temp(Src) 98.1 F (36.7 C) (Oral)  Resp 16  General appearance: alert, no distress, WD/WN Neck: supple, no lymphadenopathy, no thyromegaly, no masses Heart: RRR, normal S1, S2, no murmurs Lungs: CTA bilaterally, no wheezes, rhonchi, or rales Abdomen: +bs, soft, non tender, non distended, no masses, no hepatomegaly, no splenomegaly Pulses: 2+ radial pulses, 2+ pedal pulses, normal cap refill Ext: no edema   Assessment: Encounter Diagnoses  Name Primary?  Marland Kitchen Anemia Yes  . Other malaise and fatigue   . Infection of urinary tract   . Yeast infection   . Abdominal mass   . History of gastric ulcer    Plan: reviewed her labs from the recent visit.  Anemia - hx/o anemia, hx/o H pylori and gastric ulcer with some bleeding per endoscopy, hx/o heavy periods.  Labs today for iron. UTI - resolving on Cipro.  Return in 2-3 wk for clean catch UA. Yeast infection secondary to recent antibiotics.   Saw gyn today for this, on diflucan.  Abdominal mass - pending chart review, consider repeat MRI vs consult with GI. Hx/o  gastric ulcer - reviewed prior GI notes, endoscopy reports. Follow up: pending labs

## 2013-07-24 ENCOUNTER — Other Ambulatory Visit: Payer: Self-pay | Admitting: Medical

## 2013-07-24 ENCOUNTER — Telehealth: Payer: Self-pay | Admitting: Medical

## 2013-07-24 MED ORDER — FERROUS GLUCONATE 324 (38 FE) MG PO TABS: 324.0000 mg | ORAL_TABLET | Freq: Two times a day (BID) | ORAL | Status: DC

## 2013-07-24 NOTE — Telephone Encounter (Signed)
pls call Dr. Kenna Gilbert nurse to get message to Dr. Loreta Ave for me.   Mrs. Riolo had mass on CT abdomen several years ago.  Since then, I believe she has seen Dr. Loreta Ave for endoscopy.  Make sure they have copy of the CT report.   My questions is based on Dr. Kenna Gilbert recollection of her endoscopies and based on the CT report, would she get an MRI at this time to relook at the mass?    Pt is doing fine in general, but occasionally still gets epigastric pain, still has anemia.

## 2013-07-24 NOTE — Telephone Encounter (Signed)
Also, her labs show low iron, but thyroid labs ok.  Begin ferrous gluconate (iron) BID with food.

## 2013-07-26 NOTE — Telephone Encounter (Signed)
Will call on Monday.

## 2013-07-29 NOTE — Telephone Encounter (Signed)
I fax everything over to Dr.hungs nurse and we are waiting on a phone call back. CLS

## 2013-08-12 ENCOUNTER — Telehealth: Payer: Self-pay | Admitting: Medical

## 2013-08-12 NOTE — Telephone Encounter (Signed)
Dr. Loreta Ave called me and didn't know about an abnormal MRI when she saw her in the past.  Given this new info, she should probably go back and be seen by Dr. Loreta Ave.  She does in fact have low blood count/anemia, thus consult with Dr. Loreta Ave is advisable.

## 2013-08-12 NOTE — Telephone Encounter (Signed)
Kristin Fisher at Dr. Kenna Gilbert office said that she called the patient to schedule an appointment and the patient said she did not want the appointment all she wanted was the MRI. [name] said its for epigastric and anemia and the patient said she didn't have either one of those Sx. CLS

## 2013-08-13 NOTE — Telephone Encounter (Signed)
Patient states that she doesn't care about this right now. She doesn't have time. She is working. She said she is taking the iron supplement and she feels fine. She doesn't want to go back to Dr. Loreta Ave. I explain to her that Vincenza Hews spoke with Dr. Loreta Ave about her case and this was the next step to follow up with Dr. Loreta Ave. CLS

## 2014-04-04 ENCOUNTER — Telehealth: Payer: Self-pay

## 2014-04-04 ENCOUNTER — Ambulatory Visit (INDEPENDENT_AMBULATORY_CARE_PROVIDER_SITE_OTHER): Payer: Self-pay | Admitting: Medical

## 2014-04-04 ENCOUNTER — Encounter: Payer: Self-pay | Admitting: Medical

## 2014-04-04 VITALS — BP 102/80 | HR 82 | Temp 98.0°F | Resp 14 | Ht 61.0 in | Wt 136.0 lb

## 2014-04-04 DIAGNOSIS — Z23 Encounter for immunization: Secondary | ICD-10-CM

## 2014-04-04 DIAGNOSIS — Z0000 Encounter for general adult medical examination without abnormal findings: Secondary | ICD-10-CM

## 2014-04-04 DIAGNOSIS — N920 Excessive and frequent menstruation with regular cycle: Secondary | ICD-10-CM

## 2014-04-04 DIAGNOSIS — D509 Iron deficiency anemia, unspecified: Secondary | ICD-10-CM

## 2014-04-04 NOTE — Progress Notes (Signed)
Subjective:   HPI  Kristin Fisher is a 48 y.o. 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   Preventative care: Last ophthalmology visit:yes - Dr. Rush Barer- last eye exam 11/2013 Last dental visit: not yet Last colonoscopy:07/23/2013 Last mammogram:2012 Last gynecological exam:10/2013 Last EKG:n/a Last labs:07/2013  Prior vaccinations: TD or Tdap:08/27/1999 Influenza:allergic Pneumococcal:n/a Shingles/Zostavax:n/a  Advanced directive:n/a Health care power of attorney:n/a Living will:n/a  Concerns:no concerns  Reviewed their medical, surgical, family, social, medication, and allergy history and updated chart as appropriate.  Past Medical History  Diagnosis Date  . PUD (peptic ulcer disease) 10/2010    Dr Mann--EGD showed many antral erosions with ulcers; treated for H Pylori  . Anemia   . Other and unspecified ovarian cysts     resolved.  GYN in [place]  . 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 Internal hemorrhoids     seen on colonoscopy 10/2010  . TB lung, latent 2000    52mo of therapy through [facility]    Past Surgical History  Procedure Laterality Date  . Laparoscopy  2001    ovarian cysts  . Colonoscopy  10/2010    Dr. Loreta Ave; internal hemorrhoids  . Esophagogastroduodenoscopy  10/2010    Dr. Loreta Ave; +H pylori, antral erosions/ulcerations    History   Social History  . Marital Status: Married    Spouse Name: N/A    Number of Children: 2  . Years of Education: N/A   Occupational History  . Production designer, theatre/television/film of cafeteria at [employer]    Social History Main Topics  . Smoking status: Former Smoker    Types: Cigarettes    Quit date: 08/01/1991  . Smokeless tobacco: Never Used  . Alcohol Use: Yes     Comment: glass of wine every few months.  . Drug Use: No  . Sexual Activity: Not on file   Other Topics Concern  . Not on  file   Social History Narrative   Lives with husband, 2 daughters and a yorkie, exercise -walking, stretching.  Was Youth worker, plans to work as Lawyer 2015    Family History  Problem Relation Age of Onset  . Diabetes Mother   . Arthritis Mother   . Hypertension Mother   . Heart disease Father   . Hypertension Father   . Cancer Neg Hx     Current outpatient prescriptions:cholecalciferol (VITAMIN D) 1000 UNITS tablet, Take 1,000 Units by mouth daily., Disp: , Rfl: ;  Multiple Vitamin (MULTIVITAMIN) capsule, Take 1 capsule by mouth daily., Disp: , Rfl:   Allergies  Allergen Reactions  . Influenza Vaccines Anaphylaxis  . Ciprofloxacin Rash    Review of Systems Constitutional: -fever, -chills, -sweats, -unexpected weight change, -decreased appetite, -fatigue Allergy: -sneezing, -itching, -congestion Dermatology: -changing moles, --rash, -lumps ENT: -runny nose, -ear pain, -sore throat, -hoarseness, -sinus pain, -teeth pain, - ringing in ears, -hearing loss, -nosebleeds Cardiology: -chest pain, -palpitations, -swelling, -difficulty breathing when lying flat, -waking up short of breath Respiratory: -cough, -shortness of breath, -difficulty breathing with exercise or exertion, -wheezing, -coughing up blood Gastroenterology: -abdominal pain, -nausea, -vomiting, -diarrhea, -constipation, -blood in stool, -changes in bowel movement, -difficulty swallowing or eating Hematology: -bleeding, -bruising  Musculoskeletal: -joint aches, -muscle aches, -joint swelling, -back pain, -neck pain, -cramping, -changes in gait Ophthalmology: denies vision changes, eye redness, itching, discharge Urology: -burning with urination, -difficulty urinating, -blood in  urine, -urinary frequency, -urgency, -incontinence Neurology: -headache, -weakness, -tingling, -numbness, -memory loss, -falls, -dizziness Psychology: -depressed mood, -agitation, -sleep problems     Objective:   Physical  Exam  BP 102/80  Pulse 82  Temp(Src) 98 F (36.7 C) (Oral)  Resp 14  Ht 5\' 1"  (1.549 m)  Wt 136 lb (61.689 kg)  BMI 25.71 kg/m2  General appearance: alert, no distress, WD/WN, white female Skin: few scattered macules, no worrisome lesions HEENT: normocephalic, conjunctiva/corneas normal, sclerae anicteric, PERRLA, EOMi, nares patent, no discharge or erythema, pharynx normal Oral cavity: MMM, tongue normal, teeth in good repair Neck: supple, no lymphadenopathy, no thyromegaly, no masses, normal ROM, no bruits Chest: non tender, normal shape and expansion Heart: RRR, normal S1, S2, no murmurs Lungs: CTA bilaterally, no wheezes, rhonchi, or rales Abdomen: +bs, soft, non tender, non distended, no masses, no hepatomegaly, no splenomegaly, no bruits Back: non tender, normal ROM, no scoliosis Musculoskeletal: upper extremities non tender, no obvious deformity, normal ROM throughout, lower extremities non tender, no obvious deformity, normal ROM throughout Extremities: no edema, no cyanosis, no clubbing Pulses: 2+ symmetric, upper and lower extremities, normal cap refill Neurological: alert, oriented x 3, CN2-12 intact, strength normal upper extremities and lower extremities, sensation normal throughout, DTRs 2+ throughout, no cerebellar signs, gait normal Psychiatric: normal affect, behavior normal, pleasant  Breast/gyn/Rectal: deferred to gynecology    Assessment and Plan :    Encounter Diagnoses  Name Primary?  . Routine general medical examination at a health care facility Yes  . Anemia, iron deficiency   . Menorrhagia with regular cycle     Physical exam - discussed healthy lifestyle, diet, exercise, preventative care, vaccinations, and addressed their concerns.  Handout given.  Counseled on the Tdap (tetanus, diptheria, and acellular pertussis) vaccine.  Vaccine information sheet given. Tdap vaccine given after consent obtained.  Specific recommendations today  include:  Given your history of anemia with heavy menstrual cycles, I recommend you take over-the-counter iron such as ferrous gluconate 325 mg twice daily  I recommend you return for blood count to check the anemia in three-months  I recommend you get a mammogram no later than age 48  I recommend a Pap smear every 3 years unless you've had abnormal Pap smear  Continue to exercise regularly, eat a healthy diet  Take daily vitamin D 1000 international units over-the-counter  Follow-up pending call back

## 2014-04-04 NOTE — Patient Instructions (Signed)
  Thank you for giving me the opportunity to serve you today.    Your diagnosis today includes: Encounter Diagnoses  Name Primary?  . Routine general medical examination at a health care facility Yes  . Anemia, iron deficiency   . Menorrhagia with regular cycle      Specific recommendations today include:  Given your history of anemia with heavy menstrual cycles, I recommend you take over-the-counter iron such as ferrous gluconate 325 mg twice daily  I recommend you return for blood count to check the anemia in three-months  I recommend you get a mammogram no later than age 48  I recommend a Pap smear every 3 years unless you've had abnormal Pap smear  Continue to exercise regularly, eat a healthy diet  Take daily vitamin D 1000 international units over-the-counter  Return pending call back.

## 2014-04-04 NOTE — Telephone Encounter (Signed)
[facility] [place] chest xray done on 11-19-96 which was neg. PPD placed 10/30/1996 which was read at that time at 20x2127mill. Treatment from 11-19-1996 until 08-29-1997 with INH 300mg  everyday plus b6 50mg  for that time frame. Ava can be reached at [phone] if any other questions.

## 2014-12-10 ENCOUNTER — Telehealth: Payer: Self-pay | Admitting: *Deleted

## 2014-12-10 NOTE — Telephone Encounter (Signed)
Patient now has [employer] and needs appt set up for GYN care-requesting to see [facility]acee [facility] [place] [phone]. Will also need [facility] referral done once appt is scheduled. Please advise.

## 2014-12-10 NOTE — Telephone Encounter (Signed)
Ok, please refer 

## 2014-12-11 NOTE — Telephone Encounter (Signed)
Faxed all info to Dr Pollyann SavoySheets office attention [place] [facility]ames[place]. They will contact patient with appointment. I have not done referral.

## 2014-12-15 NOTE — Telephone Encounter (Signed)
We are not the patients PCP on her [facility] [employer] insurance card. Patient will need to contact the insurance company and have to change the PCP to [facility] first .

## 2014-12-15 NOTE — Telephone Encounter (Signed)
Patients friend made her aware of this
